# Patient Record
Sex: Female | Born: 1970
Health system: Southern US, Community
[De-identification: ages and names within clinical notes are randomized; demographics above are authoritative.]

## PROBLEM LIST (undated history)

## (undated) DIAGNOSIS — I10 Essential (primary) hypertension: Secondary | ICD-10-CM

## (undated) DIAGNOSIS — F419 Anxiety disorder, unspecified: Secondary | ICD-10-CM

---

## 2005-02-16 ENCOUNTER — Other Ambulatory Visit: Admission: RE | Admit: 2005-02-16 | Discharge: 2005-02-16 | Payer: Self-pay | Admitting: Obstetrics and Gynecology

## 2015-07-26 ENCOUNTER — Other Ambulatory Visit: Payer: Self-pay | Admitting: Obstetrics

## 2015-07-26 DIAGNOSIS — R928 Other abnormal and inconclusive findings on diagnostic imaging of breast: Secondary | ICD-10-CM

## 2015-08-02 ENCOUNTER — Ambulatory Visit
Admission: RE | Admit: 2015-08-02 | Discharge: 2015-08-02 | Disposition: A | Discharge status: Home / Self Care | Payer: BLUE CROSS/BLUE SHIELD | Source: Ambulatory Visit | Attending: Obstetrics | Admitting: Obstetrics

## 2015-08-02 DIAGNOSIS — R928 Other abnormal and inconclusive findings on diagnostic imaging of breast: Secondary | ICD-10-CM

## 2016-08-09 DIAGNOSIS — Z1231 Encounter for screening mammogram for malignant neoplasm of breast: Secondary | ICD-10-CM | POA: Diagnosis not present

## 2016-08-09 DIAGNOSIS — Z6841 Body Mass Index (BMI) 40.0 and over, adult: Secondary | ICD-10-CM | POA: Diagnosis not present

## 2016-08-09 DIAGNOSIS — Z01419 Encounter for gynecological examination (general) (routine) without abnormal findings: Secondary | ICD-10-CM | POA: Diagnosis not present

## 2016-09-07 DIAGNOSIS — Z30433 Encounter for removal and reinsertion of intrauterine contraceptive device: Secondary | ICD-10-CM | POA: Diagnosis not present

## 2016-09-07 DIAGNOSIS — Z3202 Encounter for pregnancy test, result negative: Secondary | ICD-10-CM | POA: Diagnosis not present

## 2016-09-07 DIAGNOSIS — Z6841 Body Mass Index (BMI) 40.0 and over, adult: Secondary | ICD-10-CM | POA: Diagnosis not present

## 2017-03-30 DIAGNOSIS — M545 Low back pain: Secondary | ICD-10-CM | POA: Diagnosis not present

## 2017-03-30 DIAGNOSIS — M5412 Radiculopathy, cervical region: Secondary | ICD-10-CM | POA: Diagnosis not present

## 2017-04-05 ENCOUNTER — Other Ambulatory Visit: Payer: Self-pay | Admitting: Orthopedic Surgery

## 2017-04-05 DIAGNOSIS — M545 Low back pain: Secondary | ICD-10-CM

## 2017-04-05 DIAGNOSIS — M5412 Radiculopathy, cervical region: Secondary | ICD-10-CM

## 2017-04-21 ENCOUNTER — Ambulatory Visit
Admission: RE | Admit: 2017-04-21 | Discharge: 2017-04-21 | Disposition: A | Discharge status: Home / Self Care | Payer: BLUE CROSS/BLUE SHIELD | Source: Ambulatory Visit | Attending: Orthopedic Surgery | Admitting: Orthopedic Surgery

## 2017-04-21 DIAGNOSIS — M545 Low back pain: Secondary | ICD-10-CM

## 2017-04-21 DIAGNOSIS — M5412 Radiculopathy, cervical region: Secondary | ICD-10-CM

## 2017-04-21 DIAGNOSIS — M4802 Spinal stenosis, cervical region: Secondary | ICD-10-CM | POA: Diagnosis not present

## 2017-04-23 DIAGNOSIS — M545 Low back pain: Secondary | ICD-10-CM | POA: Diagnosis not present

## 2017-04-23 DIAGNOSIS — M5416 Radiculopathy, lumbar region: Secondary | ICD-10-CM | POA: Diagnosis not present

## 2017-04-27 ENCOUNTER — Other Ambulatory Visit: Payer: Self-pay | Admitting: Orthopedic Surgery

## 2017-04-27 DIAGNOSIS — M5412 Radiculopathy, cervical region: Secondary | ICD-10-CM | POA: Diagnosis not present

## 2017-04-27 DIAGNOSIS — M545 Low back pain: Secondary | ICD-10-CM | POA: Diagnosis not present

## 2017-04-27 DIAGNOSIS — M5416 Radiculopathy, lumbar region: Secondary | ICD-10-CM

## 2017-05-15 DIAGNOSIS — M5412 Radiculopathy, cervical region: Secondary | ICD-10-CM | POA: Diagnosis not present

## 2017-05-21 DIAGNOSIS — G4709 Other insomnia: Secondary | ICD-10-CM | POA: Diagnosis not present

## 2017-05-21 DIAGNOSIS — R030 Elevated blood-pressure reading, without diagnosis of hypertension: Secondary | ICD-10-CM | POA: Diagnosis not present

## 2017-05-21 DIAGNOSIS — F419 Anxiety disorder, unspecified: Secondary | ICD-10-CM | POA: Diagnosis not present

## 2017-05-29 DIAGNOSIS — M5412 Radiculopathy, cervical region: Secondary | ICD-10-CM | POA: Diagnosis not present

## 2017-07-23 DIAGNOSIS — I10 Essential (primary) hypertension: Secondary | ICD-10-CM | POA: Diagnosis not present

## 2017-10-10 DIAGNOSIS — Z6841 Body Mass Index (BMI) 40.0 and over, adult: Secondary | ICD-10-CM | POA: Diagnosis not present

## 2017-10-10 DIAGNOSIS — Z1231 Encounter for screening mammogram for malignant neoplasm of breast: Secondary | ICD-10-CM | POA: Diagnosis not present

## 2017-10-10 DIAGNOSIS — Z01419 Encounter for gynecological examination (general) (routine) without abnormal findings: Secondary | ICD-10-CM | POA: Diagnosis not present

## 2017-11-12 DIAGNOSIS — Z6841 Body Mass Index (BMI) 40.0 and over, adult: Secondary | ICD-10-CM | POA: Diagnosis not present

## 2017-11-12 DIAGNOSIS — F419 Anxiety disorder, unspecified: Secondary | ICD-10-CM | POA: Diagnosis not present

## 2017-11-12 DIAGNOSIS — I10 Essential (primary) hypertension: Secondary | ICD-10-CM | POA: Diagnosis not present

## 2017-11-13 DIAGNOSIS — Z0000 Encounter for general adult medical examination without abnormal findings: Secondary | ICD-10-CM | POA: Diagnosis not present

## 2017-11-13 DIAGNOSIS — R7989 Other specified abnormal findings of blood chemistry: Secondary | ICD-10-CM | POA: Diagnosis not present

## 2018-02-04 DIAGNOSIS — I10 Essential (primary) hypertension: Secondary | ICD-10-CM | POA: Diagnosis not present

## 2018-05-13 DIAGNOSIS — I10 Essential (primary) hypertension: Secondary | ICD-10-CM | POA: Diagnosis not present

## 2018-05-30 ENCOUNTER — Other Ambulatory Visit: Payer: Self-pay

## 2018-05-30 ENCOUNTER — Encounter (HOSPITAL_COMMUNITY): Payer: Self-pay

## 2018-05-30 ENCOUNTER — Emergency Department (HOSPITAL_COMMUNITY)
Admission: EM | Admit: 2018-05-30 | Discharge: 2018-05-30 | Disposition: A | Discharge status: Home / Self Care | Payer: BLUE CROSS/BLUE SHIELD | Attending: Emergency Medicine | Admitting: Emergency Medicine

## 2018-05-30 DIAGNOSIS — I10 Essential (primary) hypertension: Secondary | ICD-10-CM | POA: Insufficient documentation

## 2018-05-30 DIAGNOSIS — Z79899 Other long term (current) drug therapy: Secondary | ICD-10-CM | POA: Diagnosis not present

## 2018-05-30 DIAGNOSIS — R55 Syncope and collapse: Secondary | ICD-10-CM | POA: Diagnosis not present

## 2018-05-30 HISTORY — DX: Essential (primary) hypertension: I10

## 2018-05-30 HISTORY — DX: Anxiety disorder, unspecified: F41.9

## 2018-05-30 LAB — CBC
HEMATOCRIT: 41.9 % (ref 36.0–46.0)
Hemoglobin: 13.9 g/dL (ref 12.0–15.0)
MCH: 27.5 pg (ref 26.0–34.0)
MCHC: 33.2 g/dL (ref 30.0–36.0)
MCV: 83 fL (ref 78.0–100.0)
PLATELETS: 384 10*3/uL (ref 150–400)
RBC: 5.05 MIL/uL (ref 3.87–5.11)
RDW: 15.2 % (ref 11.5–15.5)
WBC: 9.7 10*3/uL (ref 4.0–10.5)

## 2018-05-30 LAB — URINALYSIS, ROUTINE W REFLEX MICROSCOPIC
BILIRUBIN URINE: NEGATIVE
Bacteria, UA: NONE SEEN
GLUCOSE, UA: NEGATIVE mg/dL
Ketones, ur: NEGATIVE mg/dL
NITRITE: NEGATIVE
PH: 5 (ref 5.0–8.0)
Protein, ur: NEGATIVE mg/dL
SPECIFIC GRAVITY, URINE: 1.023 (ref 1.005–1.030)

## 2018-05-30 LAB — I-STAT BETA HCG BLOOD, ED (MC, WL, AP ONLY): I-stat hCG, quantitative: <5 m[IU]/mL (ref <5)

## 2018-05-30 LAB — BASIC METABOLIC PANEL
Anion gap: 14 (ref 5–15)
BUN: 19 mg/dL (ref 6–20)
CO2: 26 mmol/L (ref 22–32)
CREATININE: 0.85 mg/dL (ref 0.44–1.00)
Calcium: 9.3 mg/dL (ref 8.9–10.3)
Chloride: 100 mmol/L (ref 98–111)
GFR calc Af Amer: >60 mL/min (ref >60)
Glucose, Bld: 132 mg/dL — ABNORMAL HIGH (ref 70–99)
POTASSIUM: 3.1 mmol/L — AB (ref 3.5–5.1)
Sodium: 140 mmol/L (ref 135–145)

## 2018-05-30 LAB — CBG MONITORING, ED: Glucose-Capillary: 106 mg/dL — ABNORMAL HIGH (ref 70–99)

## 2018-05-30 LAB — I-STAT TROPONIN, ED: Troponin i, poc: 0.01 ng/mL (ref 0.00–0.08)

## 2018-05-30 NOTE — ED Triage Notes (Signed)
Patient had a syncopal episode while at the table for approx and less than 2 minutes according to the family. patient did not hit her head because she was propped up on her hands prior to the syncopal episode.

## 2018-05-30 NOTE — Discharge Instructions (Addendum)
Please follow-up with your family doctor.  You may need a cardiac event monitor or Holter monitor for further evaluation of your syncope today.  Please return if any worsening symptoms.

## 2018-05-30 NOTE — ED Provider Notes (Signed)
[facility]-EMERGENCY DEPT Provider Note   CSN: [id number] Arrival date & time: 05/30/18  1847     History   Chief Complaint Chief Complaint  Patient presents with  . Loss of Consciousness    HPI Phyllis Green is a 47 y.o. female.  HPI Phyllis JeffersonJennifer L Green is a 47 y.o. female with history of anxiety and hypertension, presents to emergency department complaining of syncopal episode.  Patient states she was done eating dinner and was sitting on the table.  Patient states she was joking with her family when suddenly she went unresponsive and fell onto the table.  Family state the patient was unconscious for about a minute, when she came to, she was startled and she threw her arms knocking everything off the table and breaking a chair.  Few seconds after patient was alert and oriented but did not know what happened.  Patient denies similar episodes in the past.  She denies any symptoms at this time.  She denies any headache, no nausea or vomiting, no dizziness, no chest pain or palpitations.  She does not remember feeling dizzy or lightheaded prior to episode.  Denies history of any arrhythmias.  She has not had any change in her medications   Past Medical History:  Diagnosis Date  . Anxiety   . Hypertension     There are no active problems to display for this patient.   Past Surgical History:  Procedure Laterality Date  . CESAREAN SECTION       OB History   None      Home Medications    Prior to Admission medications   Medication Sig Start Date End Date Taking? Authorizing Provider  DULoxetine (CYMBALTA) 60 MG capsule Take 60 mg by mouth daily. 05/02/18  Yes [provider]  hydrochlorothiazide (HYDRODIURIL) 25 MG tablet Take 25 mg by mouth daily. 05/13/18  Yes [provider]  Multiple Vitamin (MULTI-VITAMINS) TABS Take 1 tablet by mouth daily.   Yes [provider]    Family History Family History  Problem Relation Age  of Onset  . Hypertension Father   . Heart failure Father     Social History Social History   Tobacco Use  . Smoking status: Never Smoker  . Smokeless tobacco: Never Used  Substance Use Topics  . Alcohol use: Never    Frequency: Never  . Drug use: Never     Allergies   Patient has no known allergies.   Review of Systems Review of Systems  Constitutional: Negative for chills and fever.  Respiratory: Negative for cough, chest tightness and shortness of breath.   Cardiovascular: Negative for chest pain, palpitations and leg swelling.  Gastrointestinal: Negative for abdominal pain, diarrhea, nausea and vomiting.  Genitourinary: Negative for dysuria, flank pain and pelvic pain.  Musculoskeletal: Negative for arthralgias, myalgias, neck pain and neck stiffness.  Skin: Negative for rash.  Neurological: Positive for syncope. Negative for dizziness, weakness and headaches.  All other systems reviewed and are negative.    Physical Exam Updated Vital Signs Pulse 95   Temp 98.1 F (36.7 C) (Oral)   Resp 16   Ht 5\' 4"  (1.626 m)   Wt 120.2 kg   SpO2 97%   BMI 45.49 kg/m   Physical Exam  Constitutional: She is oriented to person, place, and time. She appears well-developed and well-nourished. No distress.  HENT:  Head: Normocephalic.  Eyes: Conjunctivae are normal.  Neck: Normal range of motion. Neck supple.  Cardiovascular:  Normal rate, regular rhythm and normal heart sounds.  Pulmonary/Chest: Effort normal and breath sounds normal. No respiratory distress. She has no wheezes. She has no rales.  Abdominal: Soft. Bowel sounds are normal. She exhibits no distension. There is no tenderness. There is no rebound.  Musculoskeletal: She exhibits no edema.  Neurological: She is alert and oriented to person, place, and time.  5/5 and equal upper and lower extremity strength bilaterally. Equal grip strength bilaterally. Normal finger to nose and heel to shin. No pronator drift.     Skin: Skin is warm and dry.  Psychiatric: She has a normal mood and affect. Her behavior is normal.  Nursing note and vitals reviewed.    ED Treatments / Results  Labs (all labs ordered are listed, but only abnormal results are displayed) Labs Reviewed  BASIC METABOLIC PANEL  CBC  URINALYSIS, ROUTINE W REFLEX MICROSCOPIC  CBG MONITORING, ED  I-STAT BETA HCG BLOOD, ED ([facility], [facility], [facility] ONLY)  I-STAT TROPONIN, ED    EKG None  Radiology No results found.  Procedures Procedures (including critical care time)  Medications Ordered in ED Medications - No data to display   Initial Impression / Assessment and Plan / ED Course  I have reviewed the triage vital signs and the nursing notes.  Pertinent labs & imaging results that were available during my care of the patient were reviewed by me and considered in my medical decision making (see chart for details).     With syncopal episode at home.  Doubt seizure, patient alert and oriented right after the episode.  Patient did not have a prodrome, concerning for possible arrhythmia.  Will keep on a monitor and check labs.  Has no symptoms at this time  11:15 PM Patient's labs are unremarkable other than potassium 3.1, which is most likely from her blood pressure medications.  She states she has potassium at home and will take potassium rich foods.  Her EKG, labs otherwise unremarkable.  Her [place] syncope score 0.  I am slightly concerned about possible arrhythmia with no prodromal symptoms.  I will have her follow-up closely with her family doctor, she may benefit from a Holter monitor for further evaluation as well as echo.  We did discuss return precautions.  At time of discharge her vital signs are within normal, she is in no acute distress, she has been on the monitor for multiple hours while in ED with no events.  Vitals:   05/30/18 1855 05/30/18 1858 05/30/18 2208  BP:   138/90  Pulse:  95 93  Resp:  16 (!) 21  Temp:  98.1  F (36.7 C)   TempSrc:  Oral   SpO2:  97% 96%  Weight: 120.2 kg    Height: 5\' 4"  (1.626 m)       Final Clinical Impressions(s) / ED Diagnoses   Final diagnoses:  Syncope, unspecified syncope type    ED Discharge Orders    None       Jaynie Crumble, PA-C 05/30/18 2317    Bethann Berkshire, MD 05/30/18 2321

## 2018-05-30 NOTE — ED Notes (Signed)
Pt aware that urine sample is needed.  

## 2018-05-31 DIAGNOSIS — E876 Hypokalemia: Secondary | ICD-10-CM | POA: Diagnosis not present

## 2018-05-31 DIAGNOSIS — I499 Cardiac arrhythmia, unspecified: Secondary | ICD-10-CM | POA: Diagnosis not present

## 2018-05-31 DIAGNOSIS — Z79899 Other long term (current) drug therapy: Secondary | ICD-10-CM | POA: Diagnosis not present

## 2018-09-13 DIAGNOSIS — F331 Major depressive disorder, recurrent, moderate: Secondary | ICD-10-CM | POA: Diagnosis not present

## 2018-09-13 DIAGNOSIS — I10 Essential (primary) hypertension: Secondary | ICD-10-CM | POA: Diagnosis not present

## 2018-09-13 DIAGNOSIS — F419 Anxiety disorder, unspecified: Secondary | ICD-10-CM | POA: Diagnosis not present

## 2018-09-26 DIAGNOSIS — F419 Anxiety disorder, unspecified: Secondary | ICD-10-CM | POA: Diagnosis not present

## 2018-09-26 DIAGNOSIS — F331 Major depressive disorder, recurrent, moderate: Secondary | ICD-10-CM | POA: Diagnosis not present

## 2019-02-25 IMAGING — MR MR LUMBAR SPINE W/O CM
4 of 5 series · 27 of 48 positions shown · non-contrast
Comparison: None.

CLINICAL DATA: Low back pain.  RIGHT leg pain.  Pain for 1 year.

EXAM:
MRI LUMBAR SPINE WITHOUT CONTRAST
TECHNIQUE: Multiplanar, multisequence MR imaging of the lumbar spine was
performed. No intravenous contrast was administered.

[Series 3: T2 post-contrast · sagittal · 4.0mm · 0.55mm/px · 5 of 13 slices shown]
[im 1/13]
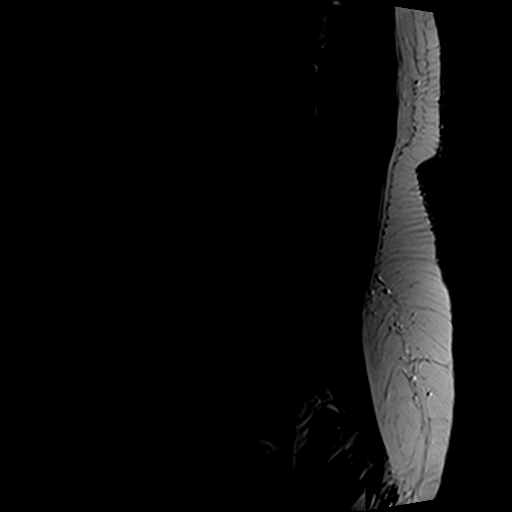
[im 4/13]
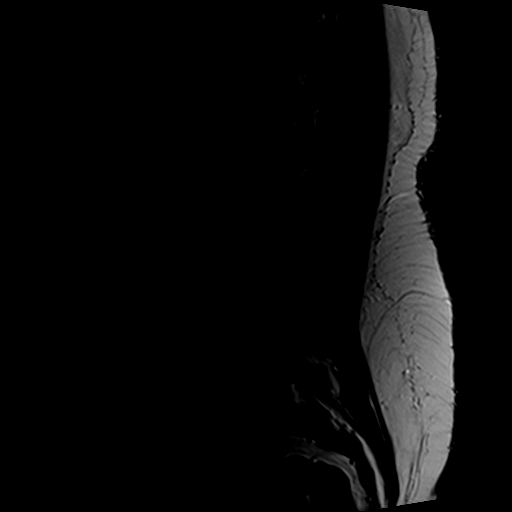
[im 7/13]
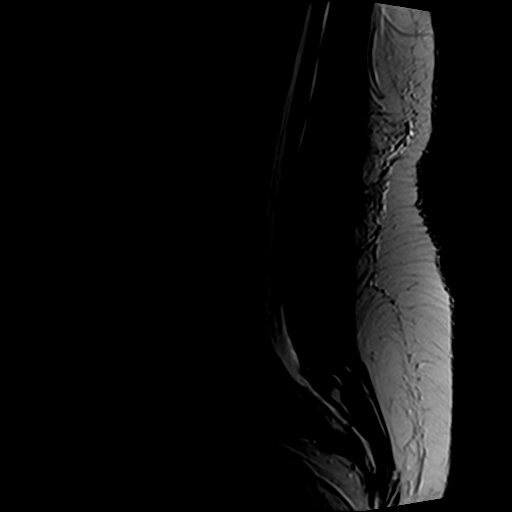
[im 10/13]
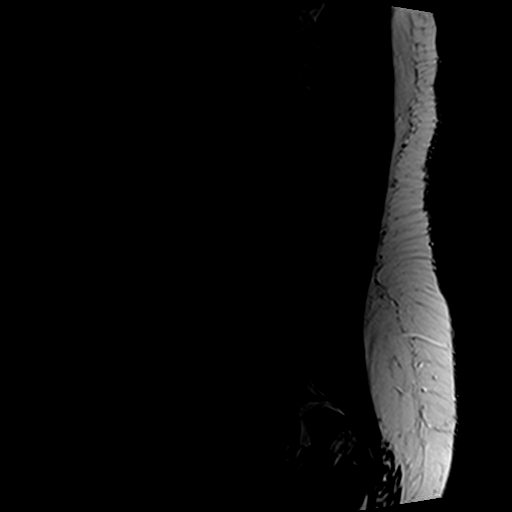
[im 13/13]
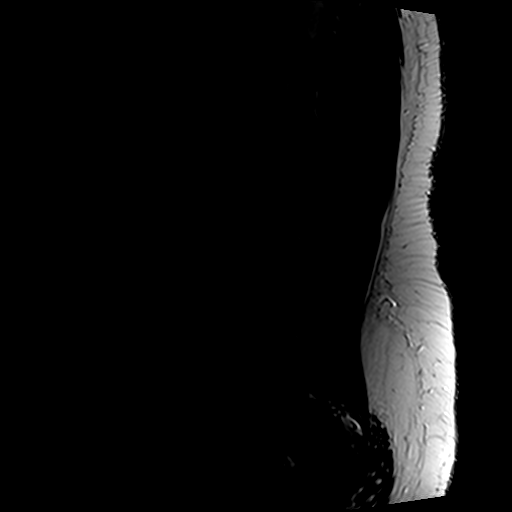

[Series 5: T1 · sagittal · 4.0mm · 0.55mm/px · 6 of 13 slices shown (1 of 2)]
[im 1/13]
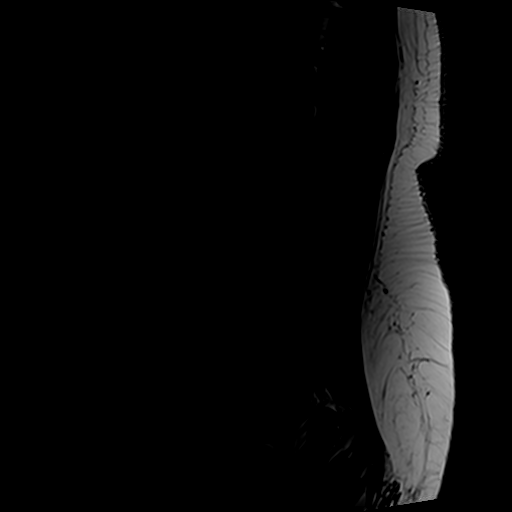
[im 3/13]
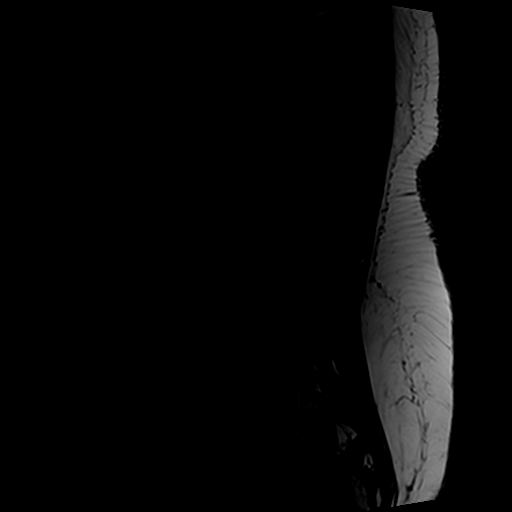
[im 5/13]
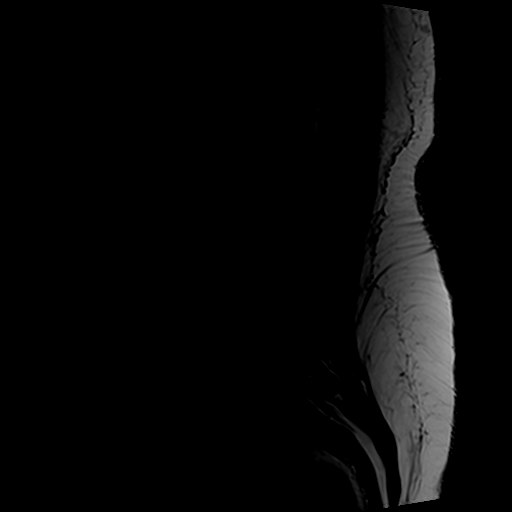
[im 8/13]
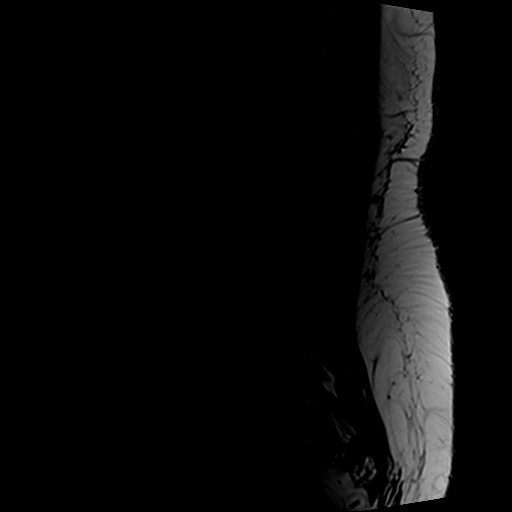
[im 10/13]
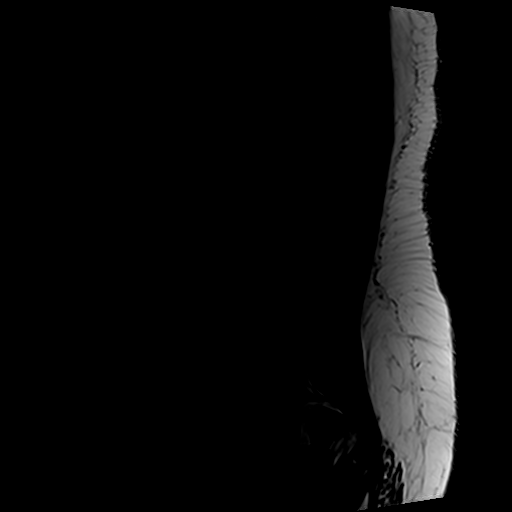
[im 13/13]
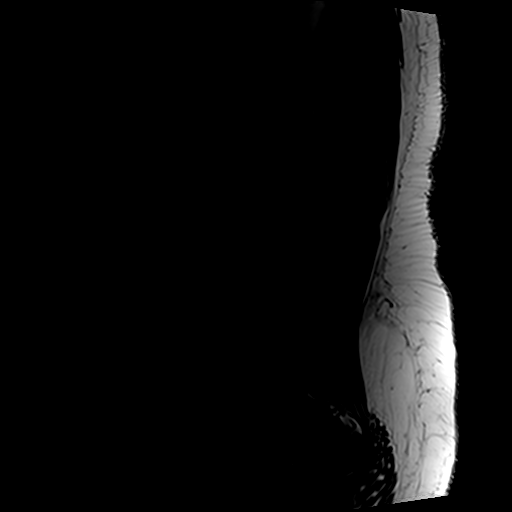

[Series 6: T1 · axial · 4.0mm · 0.35mm/px · z∈[-86,+78]mm · 6 of 36 slices shown (2 of 2)]
[im 3/36]
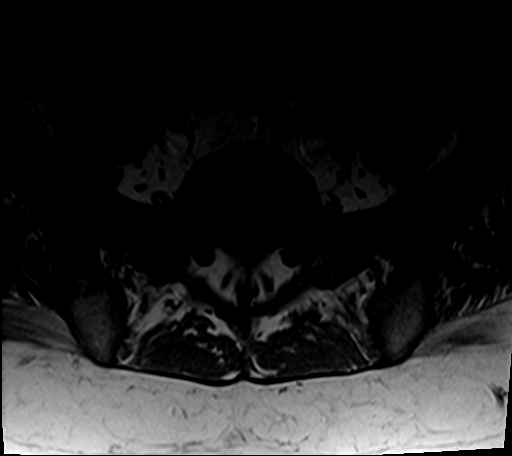
[im 5/36]
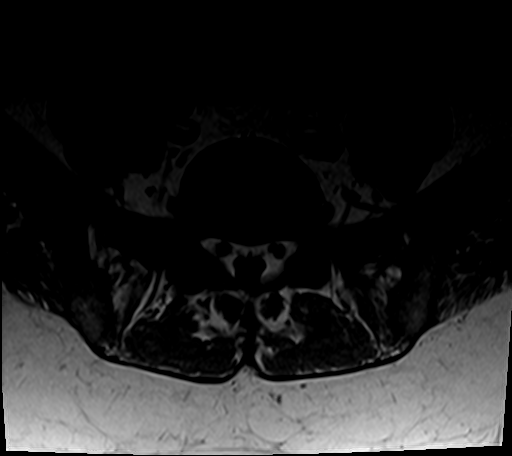
[im 8/36]
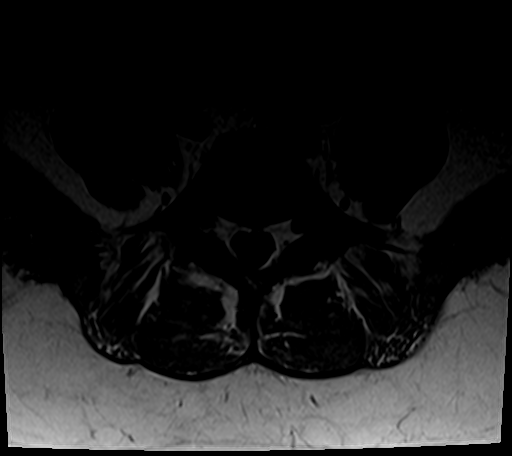
[im 12/36]
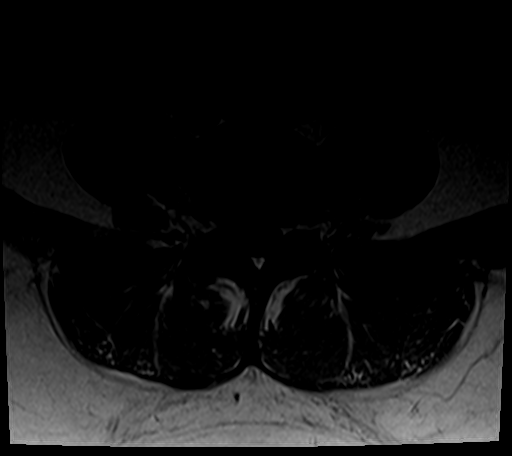
[im 19/36]
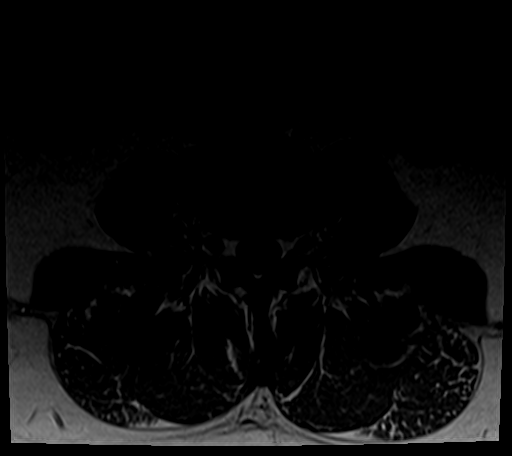
[im 31/36]
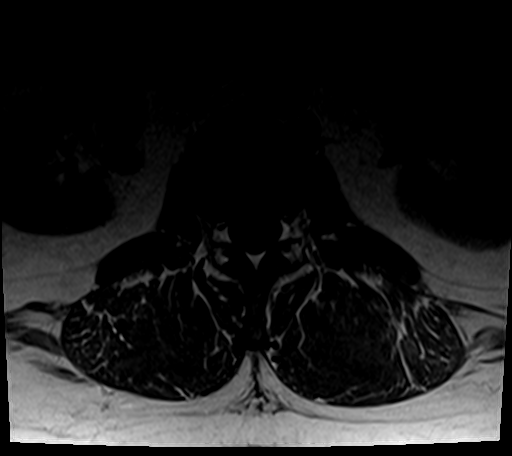

[Series 7: T2 · axial · 4.0mm · 0.70mm/px · z∈[-86,+119]mm · 10 of 36 slices shown]
[im 3/36]
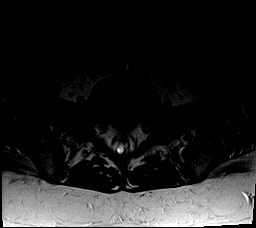
[im 5/36]
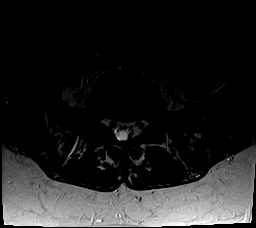
[im 8/36]
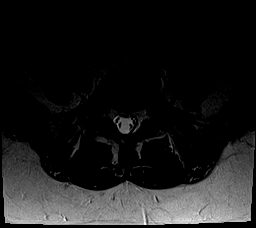
[im 12/36]
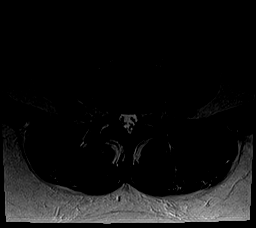
[im 17/36]
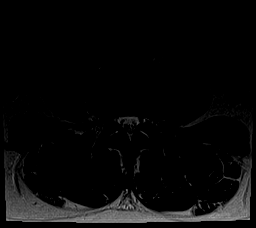
[im 19/36]
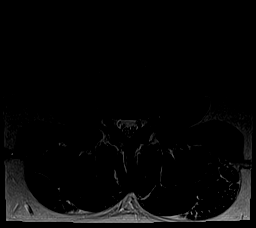
[im 22/36]
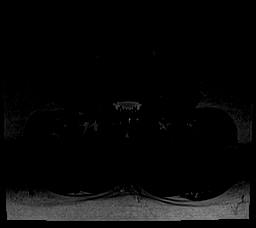
[im 26/36]
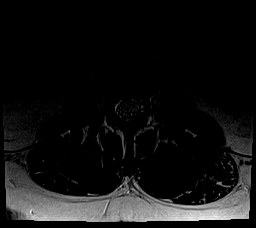
[im 31/36]
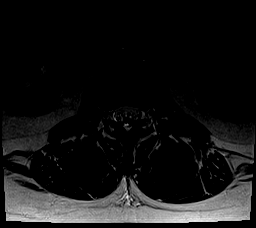
[im 36/36]
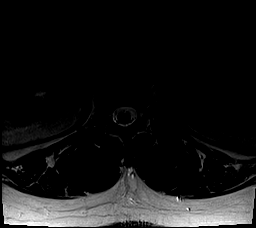

[27 of 48 positions shown; findings below may reference images not displayed]

FINDINGS: Segmentation:  Standard.

Alignment:  Physiologic.

Vertebrae: No fracture, or evidence of diskitis. Well circumscribed
cm sized bone lesion inferior L3 RIGHT paramedian location, favored
to represent incidental atypical hemangioma. Diffuse low signal
intensity bone marrow could be related to body habitus, smoking, or
anemia.

Conus medullaris: Extends to the upper L2 level and appears normal.

Paraspinal and other soft tissues: Negative.

Disc levels:

L1-L2:  Normal.

L2-L3:  Normal.

L3-L4: Slight disc desiccation with annular bulge. Mild facet
arthropathy. No impingement.

L4-L5: Normal disc space. Facet arthropathy and ligamentum flavum
hypertrophy. No impingement.

L5-S1: Disc desiccation. Annular bulge. Facet arthropathy. No
impingement.

Visualized SI joints appear symmetric and unremarkable.
IMPRESSION: No disc protrusion or spinal stenosis. No cause seen for the
reported symptoms.

Low signal intensity bone marrow, a nonspecific finding. This could
be related to body habitus, tobacco use, or anemia.

## 2019-02-28 DIAGNOSIS — F419 Anxiety disorder, unspecified: Secondary | ICD-10-CM | POA: Diagnosis not present

## 2019-02-28 DIAGNOSIS — F331 Major depressive disorder, recurrent, moderate: Secondary | ICD-10-CM | POA: Diagnosis not present

## 2019-02-28 DIAGNOSIS — I10 Essential (primary) hypertension: Secondary | ICD-10-CM | POA: Diagnosis not present

## 2019-08-06 ENCOUNTER — Telehealth: Payer: BLUE CROSS/BLUE SHIELD | Admitting: Family

## 2019-08-06 DIAGNOSIS — B9789 Other viral agents as the cause of diseases classified elsewhere: Secondary | ICD-10-CM

## 2019-08-06 DIAGNOSIS — J329 Chronic sinusitis, unspecified: Secondary | ICD-10-CM

## 2019-08-06 MED ORDER — FLUTICASONE PROPIONATE 50 MCG/ACT NA SUSP: 2.0000 | Freq: Every day | NASAL | 6 refills | Status: AC

## 2019-08-06 NOTE — Progress Notes (Signed)
We are sorry that you are not feeling well.  Here is how we plan to help!  Based on what you have shared with me it looks like you have sinusitis.  Sinusitis is inflammation and infection in the sinus cavities of the head.  Based on your presentation I believe you most likely have Acute Viral Sinusitis.This is an infection most likely caused by a virus. There is not specific treatment for viral sinusitis other than to help you with the symptoms until the infection runs its course.  You may use an oral decongestant such as Mucinex D or if you have glaucoma or high blood pressure use plain Mucinex. Saline nasal spray help and can safely be used as often as needed for congestion, continue your Fluticasone nasal spray two sprays in each nostril once a day.  I do recommend you get COVID tested based on your symptoms. You can go to one of the testing sites listed below, while they are opened (see hours). You do not need an order and will stay in your car during the test. You do need to self isolate until your results return and if positive 10 days from when your symptoms started and until you are 3 days fever free.   Testing Locations (Monday - Friday, 8 a.m. - 3:30 p.m.) . [place]: [facility], [address], [place], [place]  . [place]: [address], [place], [place] (entrance off [employer])  . [facility]: (Closed each Monday): Testing site relocated to the short stay covered drive at [facility]. (Use the [employer] entrance to [facility] next to [facility].)   Some authorities believe that zinc sprays or the use of Echinacea may shorten the course of your symptoms.  Sinus infections are not as easily transmitted as other respiratory infection, however we still recommend that you avoid close contact with loved ones, especially the very young and elderly.  Remember to wash your hands  thoroughly throughout the day as this is the number one way to prevent the spread of infection!  Home Care:  Only take medications as instructed by your medical team.  Do not take these medications with alcohol.  A steam or ultrasonic humidifier can help congestion.  You can place a towel over your head and breathe in the steam from hot water coming from a faucet.  Avoid close contacts especially the very young and the elderly.  Cover your mouth when you cough or sneeze.  Always remember to wash your hands.  Get Help Right Away If:  You develop worsening fever or sinus pain.  You develop a severe head ache or visual changes.  Your symptoms persist after you have completed your treatment plan.  Make sure you  Understand these instructions.  Will watch your condition.  Will get help right away if you are not doing well or get worse.  Your e-visit answers were reviewed by a board certified advanced clinical practitioner to complete your personal care plan.  Depending on the condition, your plan could have included both over the counter or prescription medications.  If there is a problem please reply  once you have received a response from your provider.  Your safety is important to [place].  If you have drug allergies check your prescription carefully.    You can use MyChart to ask questions about today's visit, request a non-urgent call back, or ask for a work  or school excuse for 24 hours related to this e-Visit. If it has been greater than 24 hours you will need to follow up with your provider, or enter a new e-Visit to address those concerns.  You will get an e-mail in the next two days asking about your experience.  I hope that your e-visit has been valuable and will speed your recovery. Thank you for using e-visits.   Approximately 5 minutes was spent documenting and reviewing patient's chart.

## 2022-07-19 ENCOUNTER — Emergency Department (HOSPITAL_COMMUNITY): Payer: Managed Care, Other (non HMO)

## 2022-07-19 ENCOUNTER — Other Ambulatory Visit: Payer: Self-pay

## 2022-07-19 ENCOUNTER — Encounter (HOSPITAL_COMMUNITY): Payer: Self-pay

## 2022-07-19 ENCOUNTER — Emergency Department (HOSPITAL_COMMUNITY)
Admission: EM | Admit: 2022-07-19 | Discharge: 2022-07-19 | Disposition: A | Discharge status: Home / Self Care | Payer: Managed Care, Other (non HMO) | Attending: Emergency Medicine | Admitting: Emergency Medicine

## 2022-07-19 DIAGNOSIS — K5792 Diverticulitis of intestine, part unspecified, without perforation or abscess without bleeding: Secondary | ICD-10-CM

## 2022-07-19 DIAGNOSIS — K5732 Diverticulitis of large intestine without perforation or abscess without bleeding: Secondary | ICD-10-CM | POA: Diagnosis not present

## 2022-07-19 DIAGNOSIS — R109 Unspecified abdominal pain: Secondary | ICD-10-CM | POA: Diagnosis present

## 2022-07-19 DIAGNOSIS — N9489 Other specified conditions associated with female genital organs and menstrual cycle: Secondary | ICD-10-CM | POA: Insufficient documentation

## 2022-07-19 LAB — CBC WITH DIFFERENTIAL/PLATELET
Abs Immature Granulocytes: 0.04 10*3/uL (ref 0.00–0.07)
Basophils Absolute: 0.1 10*3/uL (ref 0.0–0.1)
Basophils Relative: 1 %
Eosinophils Absolute: 0.2 10*3/uL (ref 0.0–0.5)
Eosinophils Relative: 2 %
HCT: 38.7 % (ref 36.0–46.0)
Hemoglobin: 12.2 g/dL (ref 12.0–15.0)
Immature Granulocytes: 0 %
Lymphocytes Relative: 25 %
Lymphs Abs: 2.2 10*3/uL (ref 0.7–4.0)
MCH: 26.1 pg (ref 26.0–34.0)
MCHC: 31.5 g/dL (ref 30.0–36.0)
MCV: 82.7 fL (ref 80.0–100.0)
Monocytes Absolute: 0.6 10*3/uL (ref 0.1–1.0)
Monocytes Relative: 7 %
Neutro Abs: 6 10*3/uL (ref 1.7–7.7)
Neutrophils Relative %: 65 %
Platelets: 442 10*3/uL — ABNORMAL HIGH (ref 150–400)
RBC: 4.68 MIL/uL (ref 3.87–5.11)
RDW: 15.3 % (ref 11.5–15.5)
WBC: 9.1 10*3/uL (ref 4.0–10.5)
nRBC: 0 % (ref 0.0–0.2)

## 2022-07-19 LAB — URINALYSIS, ROUTINE W REFLEX MICROSCOPIC
Bilirubin Urine: NEGATIVE
Glucose, UA: NEGATIVE mg/dL
Hgb urine dipstick: NEGATIVE
Ketones, ur: NEGATIVE mg/dL
Nitrite: NEGATIVE
Protein, ur: NEGATIVE mg/dL
Specific Gravity, Urine: 1.013 (ref 1.005–1.030)
pH: 5 (ref 5.0–8.0)

## 2022-07-19 LAB — COMPREHENSIVE METABOLIC PANEL
ALT: 19 U/L (ref 0–44)
AST: 20 U/L (ref 15–41)
Albumin: 4 g/dL (ref 3.5–5.0)
Alkaline Phosphatase: 75 U/L (ref 38–126)
Anion gap: 8 (ref 5–15)
BUN: 18 mg/dL (ref 6–20)
CO2: 24 mmol/L (ref 22–32)
Calcium: 9.1 mg/dL (ref 8.9–10.3)
Chloride: 104 mmol/L (ref 98–111)
Creatinine, Ser: 0.93 mg/dL (ref 0.44–1.00)
GFR, Estimated: >60 mL/min (ref >60)
Glucose, Bld: 102 mg/dL — ABNORMAL HIGH (ref 70–99)
Potassium: 4 mmol/L (ref 3.5–5.1)
Sodium: 136 mmol/L (ref 135–145)
Total Bilirubin: 0.3 mg/dL (ref 0.3–1.2)
Total Protein: 7.8 g/dL (ref 6.5–8.1)

## 2022-07-19 LAB — HCG, SERUM, QUALITATIVE: Preg, Serum: NEGATIVE

## 2022-07-19 LAB — I-STAT BETA HCG BLOOD, ED (MC, WL, AP ONLY): I-stat hCG, quantitative: 6.7 m[IU]/mL — ABNORMAL HIGH (ref <5)

## 2022-07-19 LAB — LIPASE, BLOOD: Lipase: 39 U/L (ref 11–51)

## 2022-07-19 MED ORDER — IOHEXOL 300 MG/ML  SOLN
100.0000 mL | Freq: Once | INTRAMUSCULAR | Status: AC | PRN
  Administered 2022-07-19: 100 mL via INTRAVENOUS

## 2022-07-19 MED ORDER — AMOXICILLIN-POT CLAVULANATE 875-125 MG PO TABS: 1.0000 | ORAL_TABLET | Freq: Two times a day (BID) | ORAL | 0 refills | Status: AC

## 2022-07-19 NOTE — Discharge Instructions (Signed)
You were seen in the emergency department for abdominal pain and change in your bowels.  Your lab work was unremarkable but your CAT scan showed acute diverticulitis.  This is treated with antibiotics although can be significant enough to require surgery.  Please start with a clear liquid diet advance as tolerated.  Follow-up with your primary care doctor.  Consider getting a referral on to GI.  Return to the emergency department if any high fevers or worsening pain.

## 2022-07-19 NOTE — ED Notes (Signed)
Pt stats that she is ready to go home, d/c pt iv, cath intact, pt verbalized understanding d/c and follow up. Pt ambulatory from dpt

## 2022-07-19 NOTE — ED Triage Notes (Signed)
Pt to er, pt states that she has a hx of "stomach issues" states that she is here for abd pain and cramping since yesterday, states that currently she is "sneezing out my butt" states that she has gas and mucous discharge. States that she is having abd pain and cramping.  States that nothing seems to make it better or worse.

## 2022-07-19 NOTE — ED Provider Notes (Signed)
[facility] DEPT Provider Note   CSN: [id number] Arrival date & time: 07/19/22  1337     History  Chief Complaint  Patient presents with   Abdominal Cramping    Phyllis Green is a 51 y.o. female.  She has no significant past medical history.  Complaining of 3 days of abdominal distention and crampy abdominal pain.  Passing some mucoid stool sometimes with a little bit of blood in it.  Nausea no vomiting.  No fevers or chills but feeling hot and cold.  No urinary symptoms.  Prior history of cholecystectomy.  Has tried nothing for her symptoms.  The history is provided by the patient.  Abdominal Cramping This is a new problem. The current episode started more than 2 days ago. The problem has not changed since onset.Associated symptoms include abdominal pain. Pertinent negatives include no chest pain, no headaches and no shortness of breath. Nothing aggravates the symptoms. Nothing relieves the symptoms. She has tried nothing for the symptoms. The treatment provided no relief.       Home Medications Prior to Admission medications   Medication Sig Start Date End Date Taking? Authorizing Provider  DULoxetine (CYMBALTA) 60 MG capsule Take 60 mg by mouth daily. 05/02/18   [provider]  fluticasone (FLONASE) 50 MCG/ACT nasal spray Place 2 sprays into both nostrils daily. 08/06/19   Evelina Dun A, FNP  hydrochlorothiazide (HYDRODIURIL) 25 MG tablet Take 25 mg by mouth daily. 05/13/18   [provider]  Multiple Vitamin (MULTI-VITAMINS) TABS Take 1 tablet by mouth daily.    [provider]      Allergies    Patient has no known allergies.    Review of Systems   Review of Systems  Constitutional:  Negative for fever.  HENT:  Negative for sore throat.   Eyes:  Negative for visual disturbance.  Respiratory:  Negative for shortness of breath.   Cardiovascular:  Negative for chest pain.  Gastrointestinal:  Positive for  abdominal pain and nausea. Negative for vomiting.  Genitourinary:  Negative for dysuria.  Skin:  Negative for rash.  Neurological:  Negative for headaches.    Physical Exam Updated Vital Signs BP 132/75 (BP Location: Left Arm)   Pulse 95   Temp 98.9 F (37.2 C) (Oral)   Resp 17   Ht 5\' 3"  (1.6 m)   Wt 122.5 kg   SpO2 97%   BMI 47.83 kg/m  Physical Exam Vitals and nursing note reviewed.  Constitutional:      General: She is not in acute distress.    Appearance: Normal appearance. She is well-developed.  HENT:     Head: Normocephalic and atraumatic.  Eyes:     Conjunctiva/sclera: Conjunctivae normal.  Cardiovascular:     Rate and Rhythm: Normal rate and regular rhythm.     Heart sounds: No murmur heard. Pulmonary:     Effort: Pulmonary effort is normal. No respiratory distress.     Breath sounds: Normal breath sounds.  Abdominal:     Palpations: Abdomen is soft.     Tenderness: There is no abdominal tenderness. There is no guarding or rebound.  Musculoskeletal:        General: Normal range of motion.     Cervical back: Neck supple.     Right lower leg: No edema.     Left lower leg: No edema.  Skin:    General: Skin is warm and dry.     Capillary Refill: Capillary refill takes  less than 2 seconds.  Neurological:     General: No focal deficit present.     Mental Status: She is alert.     ED Results / Procedures / Treatments   Labs (all labs ordered are listed, but only abnormal results are displayed) Labs Reviewed  CBC WITH DIFFERENTIAL/PLATELET - Abnormal; Notable for the following components:      Result Value   Platelets 442 (*)    All other components within normal limits  COMPREHENSIVE METABOLIC PANEL - Abnormal; Notable for the following components:   Glucose, Bld 102 (*)    All other components within normal limits  URINALYSIS, ROUTINE W REFLEX MICROSCOPIC - Abnormal; Notable for the following components:   Leukocytes,Ua TRACE (*)    Bacteria, UA RARE  (*)    All other components within normal limits  I-STAT BETA HCG BLOOD, ED ([facility], [facility], [facility] ONLY) - Abnormal; Notable for the following components:   I-stat hCG, quantitative 6.7 (*)    All other components within normal limits  LIPASE, BLOOD  HCG, SERUM, QUALITATIVE    EKG None  Radiology CT Abdomen Pelvis W Contrast  Result Date: 07/19/2022 CLINICAL DATA:  Acute abdominal pain. Nausea. Patient reports abnormal bowel movements. EXAM: CT ABDOMEN AND PELVIS WITH CONTRAST TECHNIQUE: Multidetector CT imaging of the abdomen and pelvis was performed using the standard protocol following bolus administration of intravenous contrast. RADIATION DOSE REDUCTION: This exam was performed according to the departmental dose-optimization program which includes automated exposure control, adjustment of the mA and/or kV according to patient size and/or use of iterative reconstruction technique. CONTRAST:  16mL OMNIPAQUE IOHEXOL 300 MG/ML  SOLN COMPARISON:  None Available. FINDINGS: Lower chest: No acute airspace disease or pleural effusion. Hepatobiliary: Mild subjective hepatic steatosis. No evidence of focal liver lesion. Gallbladder physiologically distended, no calcified stone. No biliary dilatation. Pancreas: No ductal dilatation or inflammation. Spleen: Normal in size without focal abnormality. Splenule at the hilum. Adrenals/Urinary Tract: Normal adrenal glands. No hydronephrosis, renal calculi or focal renal abnormality. Urinary bladder is partially distended. No bladder stone or wall thickening. Stomach/Bowel: Normal appearance of the stomach. No small bowel obstruction or inflammation. Mild fecalization of distal small bowel contents. Normal appendix. Moderate stool in the ascending and transverse colon, small volume of stool in the more distal colon. There is diverticulosis from the splenic flexure distally. There is an inflamed diverticulum involving the proximal sigmoid colon series 8, image 79 consistent  with acute diverticulitis no perforation or abscess. Vascular/Lymphatic: Normal caliber abdominal aorta. Patent portal, splenic and mesenteric veins. Small central mesenteric lymph nodes with faint edema. No enlarged lymph nodes in the abdomen or pelvis. Reproductive: T-shaped intrauterine device within the uterus. No adnexal mass. Other: No ascites or free fluid. No free air or abscess. There is a small fat containing umbilical hernia. Musculoskeletal: Lower lumbar facet hypertrophy. There are no acute or suspicious osseous abnormalities. IMPRESSION: 1. Acute uncomplicated diverticulitis of the proximal sigmoid colon. No perforation or abscess. 2. Mild hepatic steatosis. 3. Small fat containing umbilical hernia. Electronically Signed   By: Keith Rake M.D.   On: 07/19/2022 17:48    Procedures Procedures    Medications Ordered in ED Medications  iohexol (OMNIPAQUE) 300 MG/ML solution 100 mL (100 mLs Intravenous Contrast Given 07/19/22 1719)    ED Course/ Medical Decision Making/ A&P                           Medical Decision Making  Amount and/or Complexity of Data Reviewed Labs: ordered. Radiology: ordered.  Risk Prescription drug management.   This patient complains of abdominal pain loose stools with some blood; this involves an extensive number of treatment Options and is a complaint that carries with it a high risk of complications and morbidity. The differential includes colitis, diverticulitis, gastroenteritis, obstruction, perforation  I ordered, reviewed and interpreted labs, which included CBC with normal white count normal hemoglobin, chemistries and LFTs normal, pregnancy test negative I ordered imaging studies which included CT abdomen and pelvis and I independently    visualized and interpreted imaging which showed acute uncomplicated diverticulitis Previous records obtained and reviewed in epic no recent admissions Cardiac monitoring reviewed, normal sinus  rhythm Social determinants considered, no significant barriers Critical Interventions: None  After the interventions stated above, I reevaluated the patient and found patient to be well-appearing in no distress Admission and further testing considered, no indications for admission or further work-up at this time.  Will start patient on oral antibiotics and recommended close PCP follow-up.  Return instructions discussed         Final Clinical Impression(s) / ED Diagnoses Final diagnoses:  Acute diverticulitis    Rx / DC Orders ED Discharge Orders          Ordered    amoxicillin-clavulanate (AUGMENTIN) 875-125 MG tablet  Every 12 hours        07/19/22 1807              Hayden Rasmussen, MD 07/20/22 1049

## 2022-07-19 NOTE — ED Provider Triage Note (Signed)
Emergency Medicine Provider Triage Evaluation Note  Phyllis Green , a 51 y.o. female  was evaluated in triage.  Pt complains of abnormal bowel movements and nausea over the last 3 days.  Passing increased flatulence and mucus, with "spots of blood in the mucus" however not within the stool.  Denies fever and chills, vomiting, headache, recent sick contacts.  No known Hx of hemorrhoids.  Intermittent abdominal cramping.  No vaginal or urinary complaints.  This has happened once before in September last year, was given dicyclomine told she had "a UTI", and sent on her way.  Review of Systems  Positive:  Negative: See above  Physical Exam  BP (!) 161/85 (BP Location: Left Arm)   Pulse (!) 111   Temp 98.9 F (37.2 C) (Oral)   Resp 18   Ht 5\' 3"  (1.6 m)   Wt 122.5 kg   SpO2 98%   BMI 47.83 kg/m  Gen:   Awake, no distress, sitting comfortably Resp:  Normal effort  MSK:   Moves extremities without difficulty  Other:  Abdomen soft, protuberant, without obvious masses.  Minimal generalized tenderness.  Without flank tenderness.  Medical Decision Making  Medically screening exam initiated at 2:39 PM.  Appropriate orders placed.  Orson Aloe was informed that the remainder of the evaluation will be completed by another provider, this initial triage assessment does not replace that evaluation, and the importance of remaining in the ED until their evaluation is complete.     Prince Rome, PA-C 10/93/23 1441
